# Patient Record
Sex: Male | Born: 1995 | Race: White | Hispanic: No | Marital: Single | Smoking: Never smoker
Health system: Southern US, Community
[De-identification: ages and names within clinical notes are randomized; demographics above are authoritative.]

---

## 2017-07-31 ENCOUNTER — Emergency Department (HOSPITAL_BASED_OUTPATIENT_CLINIC_OR_DEPARTMENT_OTHER)
Admission: EM | Admit: 2017-07-31 | Discharge: 2017-07-31 | Disposition: A | Discharge status: Home / Self Care | Attending: Emergency Medicine | Admitting: Emergency Medicine

## 2017-07-31 ENCOUNTER — Emergency Department (HOSPITAL_BASED_OUTPATIENT_CLINIC_OR_DEPARTMENT_OTHER)

## 2017-07-31 ENCOUNTER — Encounter (HOSPITAL_BASED_OUTPATIENT_CLINIC_OR_DEPARTMENT_OTHER): Payer: Self-pay | Admitting: Emergency Medicine

## 2017-07-31 ENCOUNTER — Other Ambulatory Visit: Payer: Self-pay

## 2017-07-31 DIAGNOSIS — Y998 Other external cause status: Secondary | ICD-10-CM | POA: Insufficient documentation

## 2017-07-31 DIAGNOSIS — Z87891 Personal history of nicotine dependence: Secondary | ICD-10-CM | POA: Insufficient documentation

## 2017-07-31 DIAGNOSIS — S6992XA Unspecified injury of left wrist, hand and finger(s), initial encounter: Secondary | ICD-10-CM | POA: Diagnosis present

## 2017-07-31 DIAGNOSIS — S62525A Nondisplaced fracture of distal phalanx of left thumb, initial encounter for closed fracture: Secondary | ICD-10-CM | POA: Diagnosis not present

## 2017-07-31 DIAGNOSIS — W2109XA Struck by other hit or thrown ball, initial encounter: Secondary | ICD-10-CM | POA: Diagnosis not present

## 2017-07-31 DIAGNOSIS — Y92328 Other athletic field as the place of occurrence of the external cause: Secondary | ICD-10-CM | POA: Insufficient documentation

## 2017-07-31 DIAGNOSIS — S60222A Contusion of left hand, initial encounter: Secondary | ICD-10-CM

## 2017-07-31 DIAGNOSIS — Y9365 Activity, lacrosse and field hockey: Secondary | ICD-10-CM | POA: Diagnosis not present

## 2017-07-31 MED ORDER — IBUPROFEN 800 MG PO TABS: 800.0000 mg | ORAL_TABLET | Freq: Three times a day (TID) | ORAL | 0 refills | Status: AC

## 2017-07-31 MED ORDER — IBUPROFEN 800 MG PO TABS
800.0000 mg | ORAL_TABLET | Freq: Once | ORAL | Status: AC
  Administered 2017-07-31: 800 mg via ORAL
  Filled 2017-07-31: qty 1

## 2017-07-31 NOTE — ED Triage Notes (Signed)
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 Sts unable to move LT thumb.

## 2017-07-31 NOTE — ED Provider Notes (Signed)
[facility] EMERGENCY DEPARTMENT Provider Note   CSN: [id number] Arrival date & time: 07/31/17  1619     History   Chief Complaint Chief Complaint  Patient presents with  . Hand Pain    HPI Larry Newton is a 21 y.o. male.  HPI Patient was playing lacrosse and got struck directly by the ball on the back of his left hand and thumb.  Happened a couple hours earlier.  He reports pain is gotten worse.  He reports his thumb now feels very stiff and he cannot bend it.  No other associated injuries. History reviewed. No pertinent past medical history.  There are no active problems to display for this patient.   History reviewed. No pertinent surgical history.     Home Medications    Prior to Admission medications   Medication Sig Start Date End Date Taking? Authorizing Provider  ibuprofen (ADVIL,MOTRIN) 800 MG tablet Take 1 tablet (800 mg total) 3 (three) times daily by mouth. 07/31/17   Arby BarrettePfeiffer, Raeana Blinn, MD    Family History History reviewed. No pertinent family history.  Social History Social History   Tobacco Use  . Smoking status: Never Smoker  . Smokeless tobacco: Former [employer], [employer]  Substance Use Topics  . Alcohol use: No    Frequency: Never  . Drug use: No     Allergies   Patient has no known allergies.   Review of Systems Review of Systems Constitutional: No fever no chills no malaise Respiratory: No cough shortness of breath or chest pain.  Physical Exam Updated Vital Signs BP (!) 143/78 (BP Location: Right Arm)   Pulse (!) 108   Temp 98.1 F (36.7 C) (Oral)   Resp 20   Ht 6' (1.829 m)   Wt 76.2 kg (168 lb)   SpO2 98%   BMI 22.78 kg/m   Physical Exam  Constitutional: He is oriented to person, place, and time. He appears well-developed and well-nourished.  HENT:  Head: Normocephalic and atraumatic.  Eyes: EOM are normal.  Pulmonary/Chest: Effort normal.  Musculoskeletal:  Moderate swelling left thumb diffusely over the  interphalangeal joint.  No subungual hematomas or lacerations or abrasions.  Tender at snuffbox.  Very tender with any range of motion of thumb.  No swelling of the wrist.  Neurological: He is alert and oriented to person, place, and time. No cranial nerve deficit. He exhibits normal muscle tone. Coordination normal.  Skin: Skin is warm and dry.  Psychiatric: He has a normal mood and affect.     ED Treatments / Results  Labs (all labs ordered are listed, but only abnormal results are displayed) Labs Reviewed - No data to display  EKG  EKG Interpretation None       Radiology Dg Hand Complete Left  Result Date: 07/31/2017 CLINICAL DATA:  Pain after trauma. EXAM: LEFT HAND - COMPLETE 3+ VIEW COMPARISON:  None. FINDINGS: There is a subtle step-off in the proximal first phalanx, possibly extending into the interphalangeal joint. No other abnormalities. IMPRESSION: Subtle step-off/ irregularity of the proximal first phalanx, possibly extending into the interphalangeal joint, suspicious for a subtle fracture. Electronically Signed   By: Gerome Samavid  Williams III M.D   On: 07/31/2017 17:00    Procedures Procedures (including critical care time)  Medications Ordered in ED Medications  ibuprofen (ADVIL,MOTRIN) tablet 800 mg (not administered)     Initial Impression / Assessment and Plan / ED Course  I have reviewed the triage vital signs and the nursing notes.  Pertinent  labs & imaging results that were available during my care of the patient were reviewed by me and considered in my medical decision making (see chart for details).     Final Clinical Impressions(s) / ED Diagnoses   Final diagnoses:  Closed nondisplaced fracture of distal phalanx of left thumb, initial encounter  Contusion of left hand, initial encounter   X-ray suspicious for fracture of the first interphalangeal joint per radiology.  Patient's level of pain I do suspect this to be true nondisplaced fracture.  We  placed in thumb spica.  [place] [employer]  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  He is only here in town for the lacrosse match.  Instructed on elevating, icing and ibuprofen. ED Discharge Orders        Ordered    ibuprofen (ADVIL,MOTRIN) 800 MG tablet  3 times daily     07/31/17 1825       Arby BarrettePfeiffer, Meliya Mcconahy, MD 07/31/17 [phone]

## 2018-05-06 IMAGING — CR DG HAND COMPLETE 3+V*L*
3 series · 3 of 3 positions shown · non-contrast
Comparison: None.

CLINICAL DATA: Pain after trauma.

EXAM:
LEFT HAND - COMPLETE 3+ VIEW

[x hand pa left]
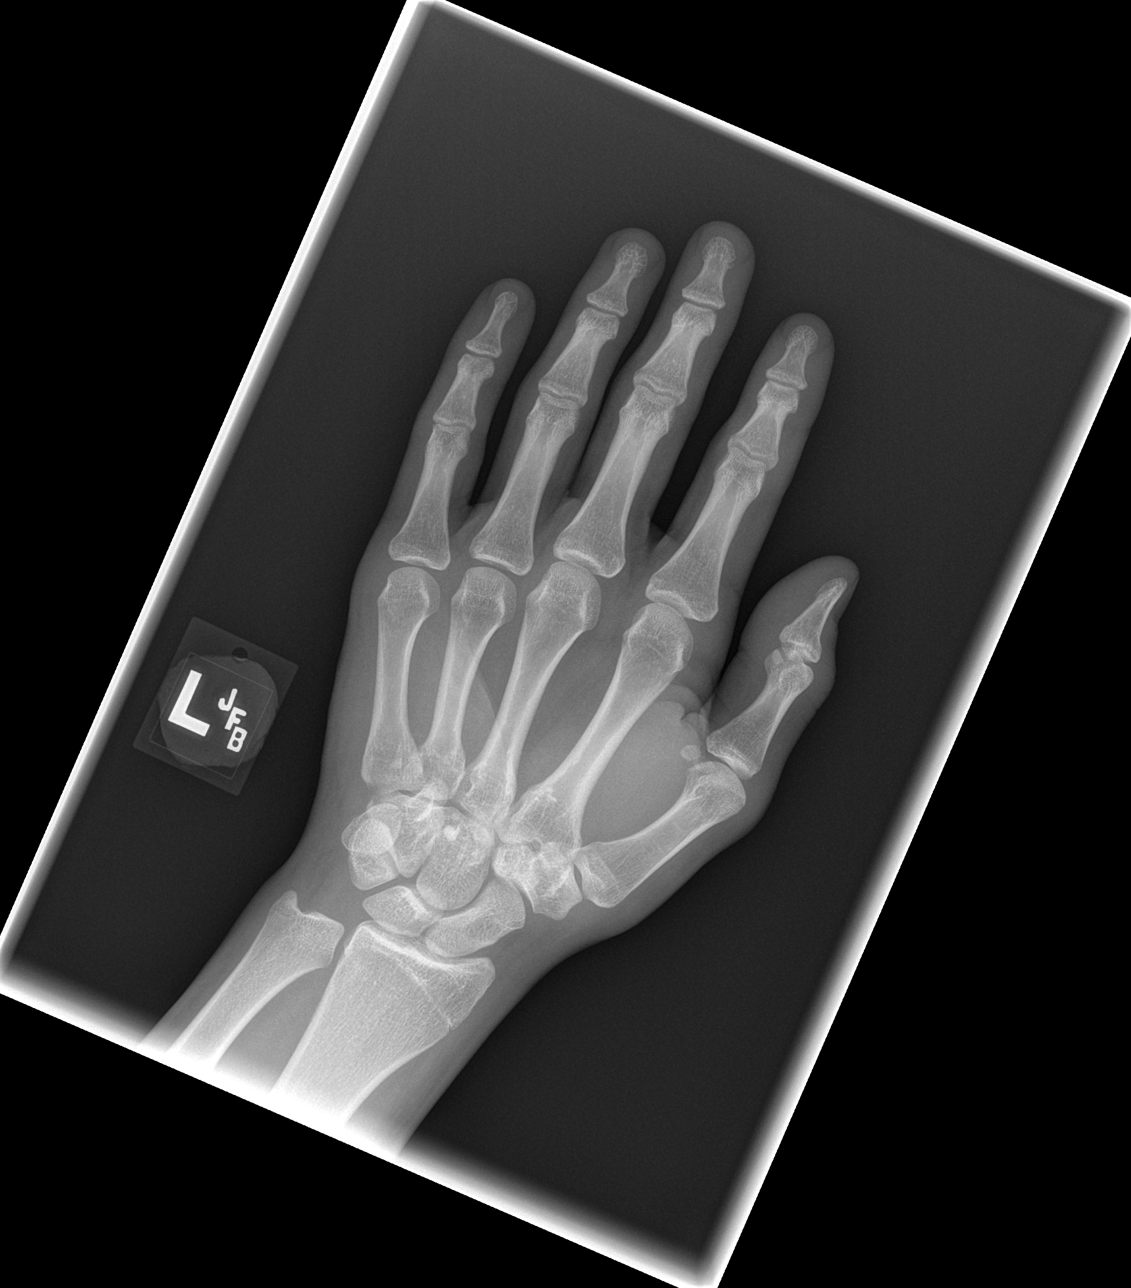

[x hand oblique left]
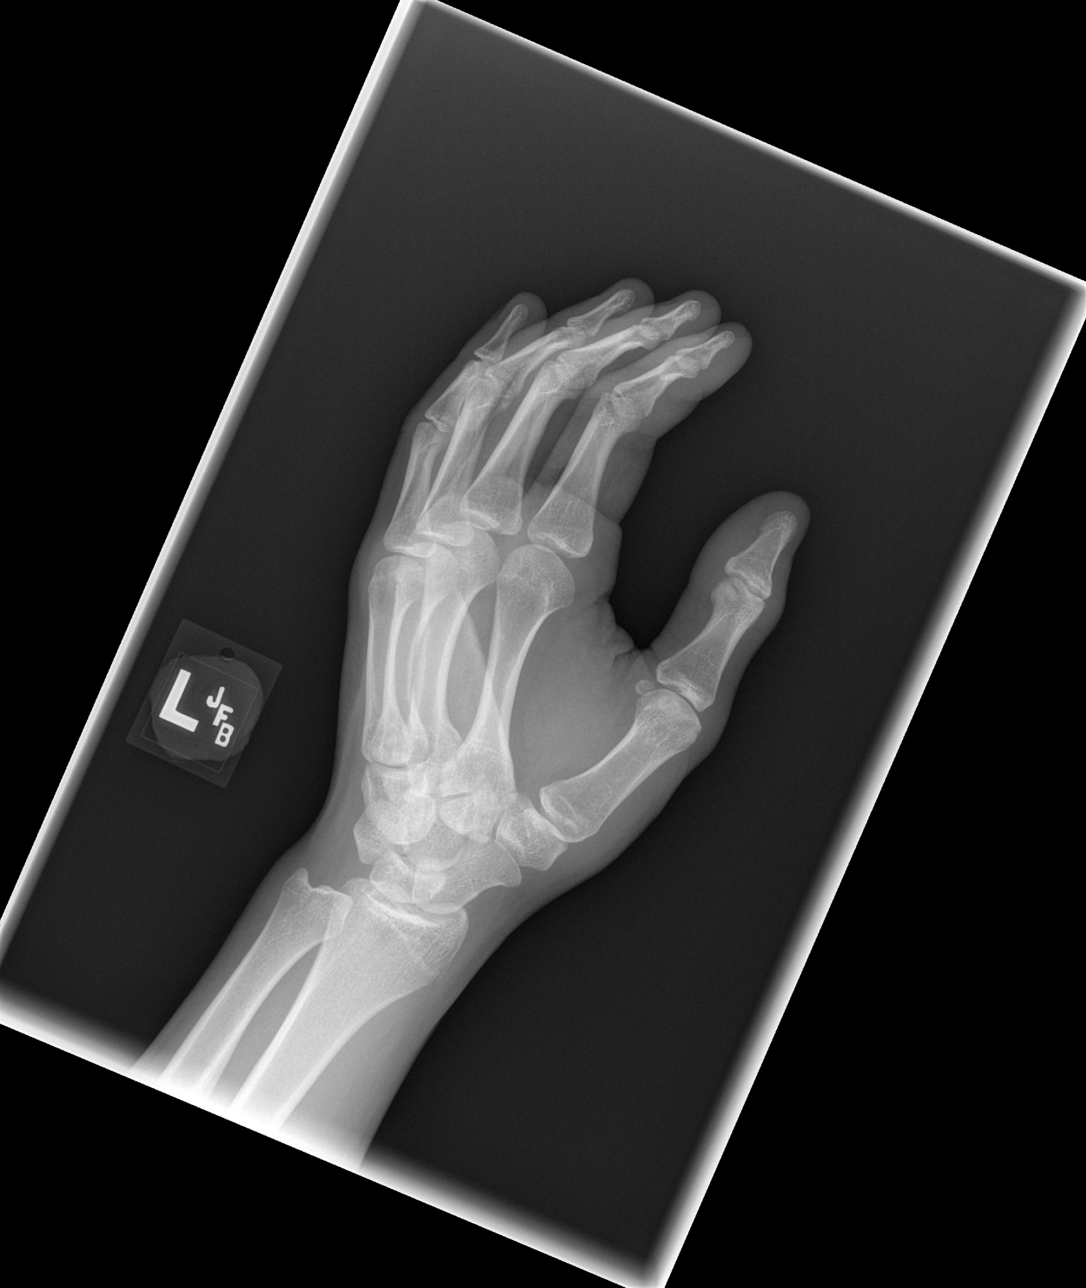

[x hand lat left]
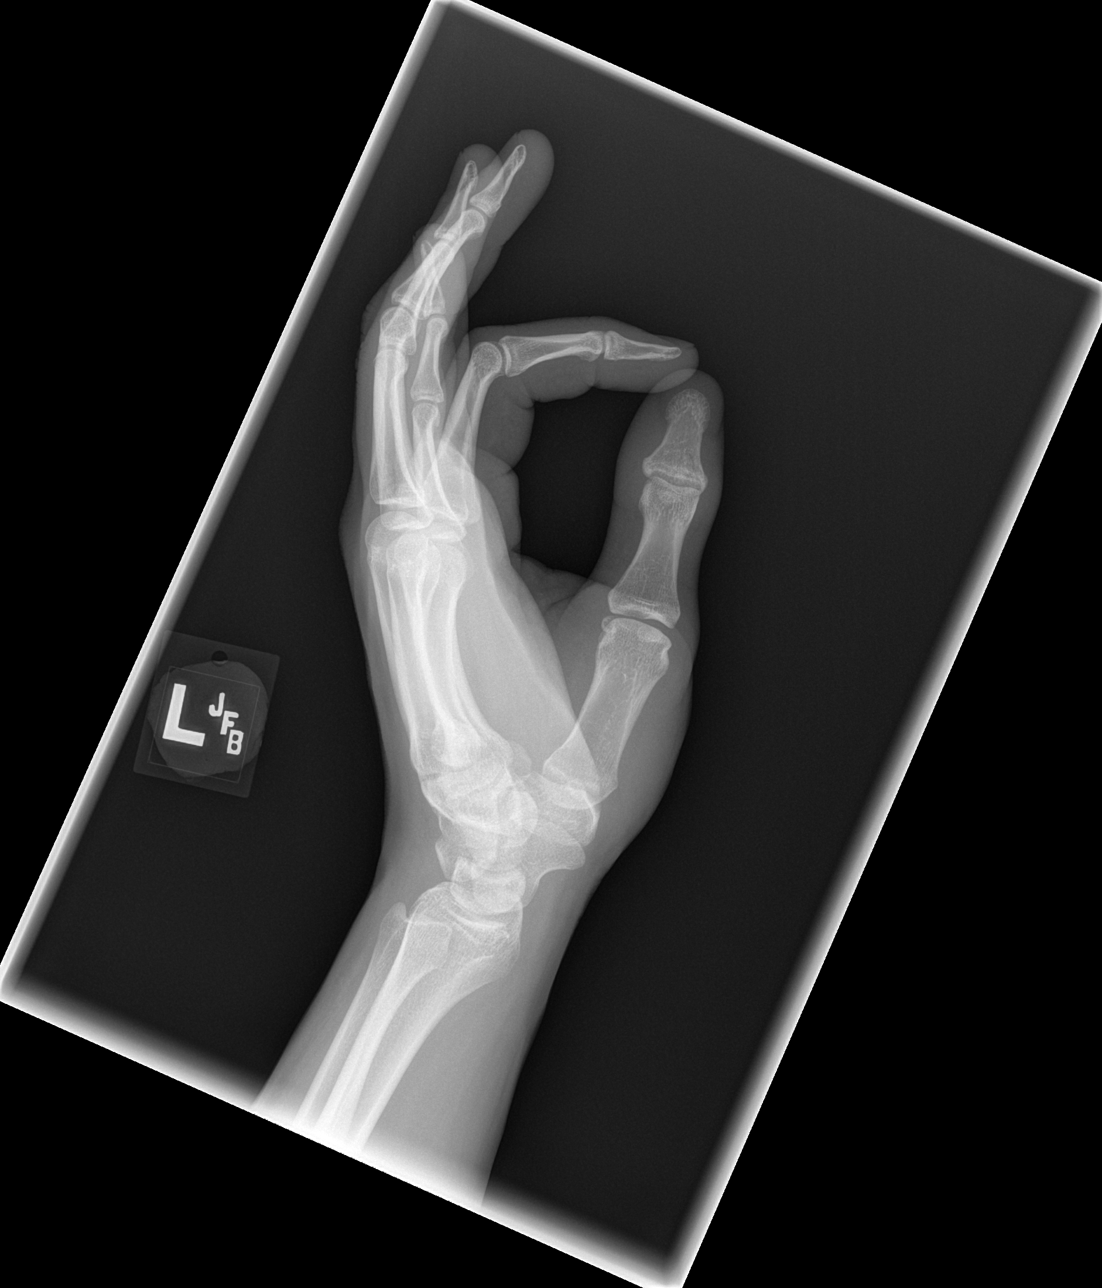

[3 of 3 positions shown; findings below may reference images not displayed]

FINDINGS: There is a subtle step-off in the proximal first phalanx, possibly
extending into the interphalangeal joint. No other abnormalities.
IMPRESSION: Subtle step-off/ irregularity of the proximal first phalanx,
possibly extending into the interphalangeal joint, suspicious for a
subtle fracture.

## 2022-06-15 ENCOUNTER — Emergency Department: Admit: 2022-06-16 | Payer: BLUE CROSS/BLUE SHIELD

## 2022-06-15 ENCOUNTER — Inpatient Hospital Stay
Admit: 2022-06-15 | Discharge: 2022-06-16 | Disposition: A | Discharge status: Home / Self Care | Payer: BLUE CROSS/BLUE SHIELD | Attending: Emergency Medicine

## 2022-06-15 DIAGNOSIS — S161XXA Strain of muscle, fascia and tendon at neck level, initial encounter: Secondary | ICD-10-CM

## 2022-06-15 NOTE — ED Provider Notes (Signed)
[facility] EMERGENCY DEPT  EMERGENCY DEPARTMENT ENCOUNTER      Pt Name: Zachary [place]  MRN: [id number]  Birthdate 10/02/1995  Date of evaluation: 06/15/2022  Provider: Carlota Raspberry, MD    CHIEF COMPLAINT       Chief Complaint   Patient presents with    Neck Injury     Patient says her hurt his back lifting weights.  He says he was doing a back squat at the gym (about 530 pm) and felt a sudden pain at the base of his neck.  He had his friends bring him home and then here to the ER.         HISTORY OF PRESENT ILLNESS    The history is provided by the patient.       Healthy 26 year old male presents with severe right posterior neck pain.  He felt some mild symptoms when he woke up but severe pain while lifting weights this evening.  Limited range of motion secondary to the discomfort.  The pain is primarily in the right paraspinal area with occasional "shooting pains" down his spine.  No visual change, vertigo or tinnitus.  No difficulty with speech or swallowing.  Pain does not radiate down his arms.    Nursing Notes were reviewed.    REVIEW OF SYSTEMS                                                                      Review of Systems    Except as noted above the remainder of the review of systems was reviewed and negative.       PAST MEDICAL HISTORY   History reviewed. No pertinent past medical history.    SURGICAL HISTORY     History reviewed. No pertinent surgical history.    CURRENT MEDICATIONS       Previous Medications    No medications on file       ALLERGIES     Patient has no known allergies.    FAMILY HISTORY     No family history on file.     SOCIAL HISTORY       Social History     Socioeconomic History    Marital status: Legally Separated     Spouse name: None    Number of children: None    Years of education: None    Highest education level: None       SCREENINGS       PHYSICAL EXAM       ED Triage Vitals [06/15/22 1952]   BP Temp Temp src Pulse Respirations SpO2 Height Weight - Scale   (!) 142/87 97.7  F (36.5 C) -- 76 16 100 % 5\' 11"  (1.803 m) 180 lb (81.6 kg)       Gen:  Alert, uncomfortable  VS: Within normal limits  HEENT: No visible trauma, pupils equal and reactive, mucous membranes moist  Neck: Limited range of motion secondary to pain.  Reproducible discomfort to palpation primarily in the posterior aspect of the neck.  Cardiovascular: Regular rate and rhythm  Lungs: No respiratory distress, O2 sat 100% on room air which is normal  Abdomen: Soft, nondistended  Musculoskeletal:  No visible deformity. No range of motion deficit.  Neurologic: Alert, gait not observed, no focal motor or sensory deficits.  No dysmetria  Skin: Warm and dry, no visible injury.    DIAGNOSTIC RESULTS     EKG: All EKG's are interpreted by the Emergency Department Physician who either signs or Co-signs this chart in the absence of a cardiologist.    RADIOLOGY    Non-plain film images such as CT, Ultrasound and MRI are read by the radiologist. Plain radiographic images are visualized and preliminarily interpreted by the emergency physician with the below findings:    Interpretation per the Radiologist below, if available at the time of this note:    [facility] [place]   Final Result   No evidence of carotid vertebral dissection or stenosis.          LABS:  Labs Reviewed - No data to display    All other labs were within normal range or not returned as of this dictation.    EMERGENCY DEPARTMENT COURSE/REASSESSMENT and MDM:   Medical Decision Making  Amount and/or Complexity of Data Reviewed  Radiology: ordered and independent interpretation performed. Decision-making details documented in ED Course.  Discussion of management or test interpretation with external provider(s): Advanced imaging reviewed with radiology    Risk  Prescription drug management.        ED Course as of 06/15/22 2206   Mon Jun 15, 2022   2038 Work-up initiated to assess for cervical arterial dissection. [RE]   2203 CT shows no bony abnormality.  Radiology  confirms no vascular abnormality.  Minimal pain relief with Toradol.  Unfortunately, I am in a situation where I cannot provide parenteral pain management.  Patient will be discharged home with medications for symptom control and a referral to his physician or a local clinic for follow-up. [RE]      ED Course User Index  [RE] Jalene Mullet, MD       PROCEDURES   Procedures        CONSULTS:  None    FINAL IMPRESSION      1. Strain of neck muscle, initial encounter          DISPOSITION/PLAN   DISPOSITION Decision To Discharge 06/15/2022 10:05:15 PM      PATIENT REFERRED TO:  [place]  [place] [address]  In 1 week        DISCHARGE MEDICATIONS:  New Prescriptions    IBUPROFEN (IBU) 400 MG TABLET    Take 1 tablet by mouth every 6 hours as needed for Pain    OXYCODONE (ROXICODONE) 5 MG IMMEDIATE RELEASE TABLET    Take 1 tablet by mouth every 6 hours as needed for Pain for up to 3 days. Intended supply: 3 days. Take lowest dose possible to manage pain Max Daily Amount: 20 mg     Controlled Substances Monitoring:          No data to display                (Please note that portions of this note were completed with a voice recognition program.  Efforts were made to edit the dictations but occasionally words are mis-transcribed.)    Jalene Mullet, MD (electronically signed)  Attending Emergency Physician           Jalene Mullet, MD  06/15/22 2206

## 2022-06-16 MED ORDER — KETOROLAC TROMETHAMINE 15 MG/ML IJ SOLN
15 MG/ML | Freq: Once | INTRAMUSCULAR | Status: AC
Start: 2022-06-16 — End: 2022-06-15
  Administered 2022-06-16: 01:00:00 15 mg via INTRAVENOUS

## 2022-06-16 MED ORDER — IBUPROFEN 400 MG PO TABS
400 MG | ORAL_TABLET | Freq: Four times a day (QID) | ORAL | 0 refills | Status: AC | PRN
Start: 2022-06-16 — End: 2023-01-01

## 2022-06-16 MED ORDER — OXYCODONE HCL 5 MG PO TABS
5 MG | ORAL_TABLET | Freq: Four times a day (QID) | ORAL | 0 refills | Status: AC | PRN
Start: 2022-06-16 — End: 2022-06-18

## 2022-06-16 MED ORDER — IOPAMIDOL 76 % IV SOLN
76 % | Freq: Once | INTRAVENOUS | Status: AC | PRN
Start: 2022-06-16 — End: 2022-06-15
  Administered 2022-06-16: 02:00:00 100 mL via INTRAVENOUS

## 2022-06-16 MED FILL — KETOROLAC TROMETHAMINE 15 MG/ML IJ SOLN: 15 MG/ML | INTRAMUSCULAR | Qty: 1

## 2023-01-01 ENCOUNTER — Encounter: Admit: 2023-01-01 | Discharge: 2023-01-01 | Payer: TRICARE (CHAMPUS) | Attending: Family Medicine

## 2023-01-01 DIAGNOSIS — G8929 Other chronic pain: Secondary | ICD-10-CM

## 2023-01-01 MED ORDER — IBUPROFEN 800 MG PO TABS
800 | ORAL_TABLET | Freq: Three times a day (TID) | ORAL | 1 refills | Status: AC
Start: 2023-01-01 — End: ?

## 2023-01-01 MED ORDER — CYCLOBENZAPRINE HCL 10 MG PO TABS
10 | ORAL_TABLET | Freq: Three times a day (TID) | ORAL | 0 refills | Status: DC | PRN
Start: 2023-01-01 — End: 2023-01-01

## 2023-01-01 MED ORDER — IBUPROFEN 800 MG PO TABS
800 | ORAL_TABLET | Freq: Three times a day (TID) | ORAL | 1 refills | Status: DC
Start: 2023-01-01 — End: 2023-01-01

## 2023-01-01 MED ORDER — CYCLOBENZAPRINE HCL 10 MG PO TABS
10 | ORAL_TABLET | Freq: Three times a day (TID) | ORAL | 0 refills | Status: AC | PRN
Start: 2023-01-01 — End: 2023-04-01

## 2023-01-01 NOTE — Progress Notes (Signed)
Date of Service: 01/01/2023  Patient: Zachary [place]  DOB: 11-Jun-1996    Chief Complaint   Patient presents with    Back Pain         History of Present Illness:  Zachary Washington a 27 y.o. male presents today for a real time interactive virtual encounter via mychart.  Consent for virtual care, including informing the patient that insurance will be billed was discussed at time of scheduling.   Zachary Washington, was evaluated through a synchronous (real-time) audio-video encounter. The patient (or guardian if applicable) is aware that this is a billable service, which includes applicable co-pays. This Virtual Visit was conducted with patient's (and/or legal guardian's) consent. Patient identification was verified, and a caregiver was present when appropriate.   The patient was located at Home: [address]  [place] [address]  Provider was located at [employer] (Appt Dept): [address] [place]  [address]  [place],  [place] [address]  Confirm you are appropriately licensed, registered, or certified to deliver care in the state where the patient is located as indicated above. If you are not or unsure, please re-schedule the visit: Yes, I confirm.      Total time spent for this encounter: Not billed by time    --Hector Brunswick, MD on 01/01/2023 at 12:09 PM    An electronic signature was used to authenticate this note.     Pt presents for evaluation of the following issues:  Patient presents today with back pain.  He has chronic issues with back pain and has been to the ER for this.  [facility].  It has recently started to bother him more often on and he would like to get a prescription for this.  He was seen in the ER and would like to get the same medication.  He was placed on Motrin and oxycodone.    Current Outpatient Medications   Medication Sig Dispense Refill    ibuprofen (ADVIL;MOTRIN) 800 MG tablet Take 1 tablet by mouth 3 times daily (with meals) 270 tablet 1     cyclobenzaprine (FLEXERIL) 10 MG tablet Take 1 tablet by mouth 3 times daily as needed for Muscle spasms 90 tablet 0     No current facility-administered medications for this visit.       No Known Allergies  Patient Active Problem List   Diagnosis    Back pain     History reviewed. No pertinent past medical history.  History reviewed. No pertinent surgical history.  History reviewed. No pertinent family history.  Social History     Tobacco Use    Smoking status: Never    Smokeless tobacco: Never   Substance Use Topics    Alcohol use: Not on file     Social History     Social History Narrative    Not on file         ROS:    ROS as per HPI or otherwise negative.        PHYSICAL EXAM:    Patient is alert, cooperative, well-groomed. Normal skin color. Appears in no respiratory distress, appears with no signs of discomfort.      ASSESSMENT and PLAN:  1. Chronic back pain, unspecified back location, unspecified back pain laterality  Have told him not to take the Motrin on an empty stomach.  He is only to take the muscle relaxant as needed.  He is not to mix it with alcohol  or any other sedating substances.  He is not to take it while working or driving.  We cannot prescribe oxycodone on this platform  - ibuprofen (ADVIL;MOTRIN) 800 MG tablet; Take 1 tablet by mouth 3 times daily (with meals)  Dispense: 270 tablet; Refill: 1  - cyclobenzaprine (FLEXERIL) 10 MG tablet; Take 1 tablet by mouth 3 times daily as needed for Muscle spasms  Dispense: 90 tablet; Refill: 0  Patient was seen using his computer for video and phone for audio    Return if symptoms worsen or fail to improve.        Hector Brunswick, MD

## 2023-01-06 ENCOUNTER — Ambulatory Visit: Admit: 2023-01-06 | Discharge: 2023-01-06 | Payer: PRIVATE HEALTH INSURANCE

## 2023-01-06 DIAGNOSIS — L03211 Cellulitis of face: Secondary | ICD-10-CM

## 2023-01-06 MED ORDER — DEXAMETHASONE SODIUM PHOSPHATE 10 MG/ML IJ SOLN
10 | Freq: Once | INTRAMUSCULAR | Status: AC
Start: 2023-01-06 — End: 2023-01-06
  Administered 2023-01-06: 18:00:00 10 mg via INTRAMUSCULAR

## 2023-01-06 MED ORDER — CEPHALEXIN 500 MG PO CAPS
500 | ORAL_CAPSULE | Freq: Three times a day (TID) | ORAL | 0 refills | Status: AC
Start: 2023-01-06 — End: 2023-01-13

## 2023-01-06 MED ORDER — CEFTRIAXONE SODIUM 1 G IJ SOLR
1 | Freq: Once | INTRAMUSCULAR | Status: AC
Start: 2023-01-06 — End: 2023-01-06
  Administered 2023-01-06: 18:00:00 1000 mg via INTRAMUSCULAR

## 2023-01-06 MED ORDER — PREDNISONE 20 MG PO TABS
20 | ORAL_TABLET | ORAL | 0 refills | Status: AC
Start: 2023-01-06 — End: 2023-01-13

## 2023-01-06 NOTE — Progress Notes (Unsigned)
Alison Glas (DOB:  31-Aug-1996) is a 27 y.o. male, here for evaluation of the following chief complaint(s): Other (Bump on head x 24 hrs)      Assessment & Plan     Subjective   SUBJECTIVE/OBJECTIVE:    This patient is a 27 year old male who presents today for evaluation of a swollen lump on his forehead, which he first noted 1 day ago. He noticed a pimple to the middle of his forehead yesterday. While at work wearing a hat, he noticed his forehead felt irritated. When he took his hat off, his forehead was swollen. He attempted to drain the pimple thinking he might have pus collected under his skin. He has noted increased tenderness to his forehead since then. No redness to the forehead. Denies fevers, chills, body aches.     No Known Allergies     Current Outpatient Medications on File Prior to Visit   Medication Sig Dispense Refill    ibuprofen (ADVIL;MOTRIN) 800 MG tablet Take 1 tablet by mouth 3 times daily (with meals) (Patient not taking: Reported on 01/06/2023) 270 tablet 1    cyclobenzaprine (FLEXERIL) 10 MG tablet Take 1 tablet by mouth 3 times daily as needed for Muscle spasms (Patient not taking: Reported on 01/06/2023) 90 tablet 0     No current facility-administered medications on file prior to visit.            No past medical history on file.     No past surgical history on file.     Social History     Tobacco Use    Smoking status: Never    Smokeless tobacco: Never        Review of Systems  See HPI; otherwise ROS negative.         Objective   BP (!) 139/94   Pulse 71   Temp 98 F (36.7 C)   Resp 16   SpO2 100%    Physical Exam  No scans are attached to the encounter.  No results found for any visits on 01/06/23.          ASSESSMENT/PLAN:  1. Cellulitis of face  -     dexAMETHasone (DECADRON) injection 10 mg; 10 mg, IntraMUSCular, ONCE, 1 dose, On Wed 01/06/23 at 1415  -     cefTRIAXone (ROCEPHIN) injection 1,000 mg; 1,000 mg, IntraMUSCular, ONCE, 1 dose, On Wed 01/06/23 at 1415Antimicrobial  Indications: Skin and Soft Tissue InfectionSkin duration of therapy: OtherOther Skin and Soft Tissue Infection Duration: onceReconstitute with 3.6 mL 1% lidocaine or sterile water.  -     cephALEXin (KEFLEX) 500 MG capsule; Take 1 capsule by mouth 3 times daily for 7 days, Disp-21 capsule, R-0Normal  -     predniSONE (DELTASONE) 20 MG tablet; Take 2 tablets by mouth daily for 3 days, THEN 1 tablet daily for 4 days., Disp-10 tablet, R-0Normal      Patient to call our office or return with any questions, concerns or worsening signs or symptoms.    An electronic signature was used to authenticate this note.    --Cathleen Fears, APRN - CNP

## 2023-01-08 ENCOUNTER — Inpatient Hospital Stay
Admit: 2023-01-08 | Discharge: 2023-01-08 | Disposition: A | Discharge status: Home / Self Care | Payer: PRIVATE HEALTH INSURANCE | Attending: Emergency Medicine

## 2023-01-08 DIAGNOSIS — L989 Disorder of the skin and subcutaneous tissue, unspecified: Secondary | ICD-10-CM

## 2023-01-08 NOTE — ED Provider Notes (Signed)
[facility] EMERGENCY DEPT  EMERGENCY DEPARTMENT ENCOUNTER      Pt Name: Zachary Washington  MRN: [id number]  Birthdate [birth year]  Date of evaluation: 01/08/2023  Provider: Delrae Sawyers, MD  Provider evaluation time: 01/08/23 1019    CHIEF COMPLAINT       Chief Complaint   Patient presents with    Wound Check     Possible spider bite to forehead, given antibiotics Wednesday but woke up today and feels like area is more swollen          HISTORY OF PRESENT ILLNESS    27 year old male status post a suspected bite to the forehead.  He suffered this bite 3 days ago.  He subsequently had an immediate reaction which was treated at urgent care with steroids and antibiotics.  The swelling has subsided but some of it has fallen down dependently into just underneath his eye.  He denies any trouble breathing or swelling in other areas.  No itching.  Denies fever.           Nursing Notes were reviewed.    REVIEW OF SYSTEMS     Review of Systems   Constitutional:  Negative for fever.   Respiratory:  Negative for shortness of breath.    Cardiovascular:  Negative for chest pain and palpitations.   Gastrointestinal:  Negative for abdominal pain, nausea and vomiting.   Genitourinary:  Negative for dysuria.   Musculoskeletal:  Negative for back pain.   Skin:  Negative for rash.   Neurological:  Negative for dizziness, weakness and headaches.   All other systems reviewed and are negative.      Except as noted above the remainder of the review of systems was reviewed and negative.     PAST MEDICAL HISTORY   No past medical history on file.    SURGICAL HISTORY     No past surgical history on file.    CURRENT MEDICATIONS       Previous Medications    CEPHALEXIN (KEFLEX) 500 MG CAPSULE    Take 1 capsule by mouth 3 times daily for 7 days    CYCLOBENZAPRINE (FLEXERIL) 10 MG TABLET    Take 1 tablet by mouth 3 times daily as needed for Muscle spasms    IBUPROFEN (ADVIL;MOTRIN) 800 MG TABLET    Take 1 tablet by mouth 3 times daily (with meals)     PREDNISONE (DELTASONE) 20 MG TABLET    Take 2 tablets by mouth daily for 3 days, THEN 1 tablet daily for 4 days.       ALLERGIES     Patient has no known allergies.    FAMILY HISTORY     No family history on file.     SOCIAL HISTORY       Social History     Socioeconomic History    Marital status: Legally Separated   Tobacco Use    Smoking status: Never    Smokeless tobacco: Never       SCREENINGS         Glasgow Coma Scale  Eye Opening: Spontaneous  Best Verbal Response: Oriented  Best Motor Response: Obeys commands  Glasgow Coma Scale Score: 15                     CIWA Assessment  BP: (!) 141/85  Pulse: 83                 PHYSICAL EXAM  ED Triage Vitals [01/08/23 1024]   BP Temp Temp Source Pulse Respirations SpO2 Height Weight - Scale   (!) 141/85 98.8 F (37.1 C) Oral 83 16 98 % 1.803 m (5\' 11" ) 85.3 kg (188 lb)       Physical Exam  Vitals and nursing note reviewed.   Constitutional:       General: He is not in acute distress.  HENT:      Head: Normocephalic and atraumatic.      Mouth/Throat:      Mouth: Mucous membranes are moist.      Comments: Airway patent  Eyes:      Pupils: Pupils are equal, round, and reactive to light.   Cardiovascular:      Rate and Rhythm: Normal rate and regular rhythm.   Pulmonary:      Effort: No respiratory distress.      Breath sounds: No wheezing or rhonchi.   Abdominal:      Palpations: Abdomen is soft.      Tenderness: There is no abdominal tenderness.   Musculoskeletal:      Cervical back: Neck supple.      Comments: Neurovascular exam intact distally in all extremities   Skin:     General: Skin is warm and dry.   Neurological:      General: No focal deficit present.      Mental Status: He is alert and oriented to person, place, and time. Mental status is at baseline.   Psychiatric:         Mood and Affect: Mood normal.         Behavior: Behavior normal.         Procedures    DIAGNOSTIC RESULTS     EKG: All EKG's are interpreted by the Emergency Department Physician  who either signs or Co-signs this chart in the absence of a cardiologist.    RADIOLOGY:   No orders to display       LABS:  Labs Reviewed - No data to display    All other labs were within normal range or not returned as of this dictation.    EMERGENCY DEPARTMENT COURSE/REASSESSMENT and MDM:          MDM  Number of Diagnoses or Management Options  Skin lesion of face  Diagnosis management comments: 27 year old male with mild edema surrounding a likely insect bite on the forehead which is now falling dependently to around the orbit.  The swelling is minimal.  There is no significant tenderness.  No erythema.  No fever.  There is no fluctuance to suggest a drainable lesion.  At this juncture the treatment he is on from urgent care should suffice.  I have asked him to monitor carefully and return with any worsening.    Risk of Complications, Morbidity, and/or Mortality  Presenting problems: moderate  Management options: moderate    Patient Progress  Patient progress: improved        FINAL IMPRESSION      1. Skin lesion of face          DISPOSITION/PLAN   DISPOSITION Decision To Discharge 01/08/2023 10:53:05 AM      PATIENT REFERRED TO:  [facility]  [address]  [place] [address]  [phone]    As needed      DISCHARGE MEDICATIONS:  New Prescriptions    No medications on file         (Please note that portions of this note  were completed with a voice recognition program.  Efforts were made to edit the dictations but occasionally words are mis-transcribed.)    Delrae Sawyers, MD (electronically signed)  Emergency Medicine           Delrae Sawyers, MD  01/08/23 1054

## 2024-04-10 ENCOUNTER — Ambulatory Visit: Admit: 2024-04-10 | Discharge: 2024-04-10 | Payer: PRIVATE HEALTH INSURANCE

## 2024-04-10 DIAGNOSIS — M79644 Pain in right finger(s): Principal | ICD-10-CM

## 2024-04-10 MED ORDER — KETOROLAC TROMETHAMINE 30 MG/ML IJ SOLN
30 | Freq: Once | INTRAMUSCULAR | Status: AC
Start: 2024-04-10 — End: 2024-04-10
  Administered 2024-04-10: 13:00:00 30 mg via INTRAMUSCULAR

## 2024-04-10 NOTE — Progress Notes (Signed)
 CHIEF COMPLAINT:  Chief Complaint   Patient presents with    Other     Pt smashed his right hand thumb sat . Per Pt no allergy to any Meds         HISTORY OF PRESENT ILLNESS:  Patient smashed his right thumb on Saturday and has had considerable pain and pressure since then.  He is able to bend it but it is painful.  It is mainly tender to touch on the tip.  No numbness.  No prior injury.      CURRENT MEDICATION LIST:    Current Outpatient Medications   Medication Sig Dispense Refill    ibuprofen  (ADVIL ;MOTRIN ) 800 MG tablet Take 1 tablet by mouth 3 times daily (with meals) 270 tablet 1     No current facility-administered medications for this visit.        ALLERGIES:    No Known Allergies     HISTORY:  History reviewed. No pertinent past medical history.   History reviewed. No pertinent surgical history.   Social History     Socioeconomic History    Marital status: Legally Separated     Spouse name: Not on file    Number of children: Not on file    Years of education: Not on file    Highest education level: Not on file   Occupational History    Not on file   Tobacco Use    Smoking status: Never    Smokeless tobacco: Never   Substance and Sexual Activity    Alcohol use: Not on file    Drug use: Not on file    Sexual activity: Not on file   Other Topics Concern    Not on file   Social History Narrative    Not on file     Social Drivers of Health     Financial Resource Strain: Not on file   Food Insecurity: No Food Insecurity (01/27/2024)    Received from [facility]     Hunger Vital Sign     Worried About Running Out of Food in the Last Year: Never true     Ran Out of Food in the Last Year: Never true   Transportation Needs: No Transportation Needs (01/27/2024)    Received from [facility]     [employer] - Transportation     Lack of Transportation (Medical): No     Lack of Transportation (Non-Medical): No   Physical Activity: Not on file   Stress: Not on file   Social  Connections: Not on file   Intimate Partner Violence: Not on file   Housing Stability: Not on file      History reviewed. No pertinent family history.     REVIEW OF SYSTEMS    Review of Systems   All other systems reviewed and are negative.       PHYSICAL EXAM:    Physical Exam  Constitutional:       Appearance: Normal appearance.   HENT:      Head: Normocephalic.   Cardiovascular:      Rate and Rhythm: Normal rate.   Pulmonary:      Effort: Pulmonary effort is normal.   Musculoskeletal:      Cervical back: Neck supple.      Comments: Right thumb--distal phalanx is edematous and very tender to palpation.  Minimal discoloration.  There is a subungual hematoma approximately 50% of the proximal nail.   Skin:  General: Skin is warm and dry.   Neurological:      General: No focal deficit present.      Mental Status: He is alert and oriented to person, place, and time.   Psychiatric:         Mood and Affect: Mood normal.        Vital Signs -   Visit Vitals  BP (!) 144/81   Pulse 64   Temp 98 F (36.7 C) (Oral)   Resp 17   Ht 1.803 m (5' 11)   Wt 91 kg (200 lb 11.2 oz)   SpO2 99%   BMI 27.99 kg/m                LABS and XRAY  Results for orders placed or performed in visit on 04/10/24   XR HAND RIGHT (MIN 3 VIEWS)    Narrative    RIGHT HAND X-RAY: 04/10/24    INDICATION: Trauma and pain    COMPARISON: None    TECHNIQUE: 3 view    FINDINGS: No fracture or subluxation is demonstrated. No foreign object. No   arthropathy.      Impression    Negative              ASSESMENT AND PLAN:    Assessment & Plan  Thumb pain, right       Orders:    XR HAND RIGHT (MIN 3 VIEWS); Future    ketorolac  (TORADOL ) injection 30 mg    DRAIN BLOOD FROM UNDER NAIL    Crushing injury of right thumb, initial encounter       Orders:    XR HAND RIGHT (MIN 3 VIEWS); Future    ketorolac  (TORADOL ) injection 30 mg    Elevated BP without diagnosis of hypertension            Crush injury right thumb--x-ray negative.  Patient's right thumb was  sterilized with Betadine and then a sterile Bovie was used to drill a 2 to 3 mm hole in the nail.  Moderate amount of blood was drained from the nail.  It was then soaked in hydrogen peroxide and warm water.  After about 10 or 15 minutes sterile dressing was placed.  Patient was instructed to soak the nail several times a day in warm water and peroxide.  Return to clinic if any new or worsening symptoms.  Follow-up with primary care provider regarding elevated blood pressure but may just be due to pain, repeat blood pressure within normal.    EDUCATION:   There are no Patient Instructions on file for this visit.        Follow up and Dispositions:  No follow-ups on file.       Darice Jackelyn Illingworth, PA    I have reviewed prior visit notes and lab results pertinent to this visit. Point of care results and imaging were independently interpreted by myself and discussed with the patient. Educated the patient on disease process, expected course of illness, and management. Educated the patient on how to administer prescribed medications and possible side effects.  Patient instructed to follow-up immediately for any new or worsening symptoms.  Patient verbalized understanding and agreement with plan of care.    Please note that this note was generated using voice recognition Scientist, clinical (histocompatibility and immunogenetics).  Although every effort was made to ensure the accuracy of this automated transcription, some errors in transcription may have occurred.
